# Patient Record
Sex: Male | Born: 2000 | Race: White | Hispanic: No | Marital: Single | Smoking: Never smoker
Health system: Southern US, Community
[De-identification: ages and names within clinical notes are randomized; demographics above are authoritative.]

## PROBLEM LIST (undated history)

## (undated) DIAGNOSIS — L709 Acne, unspecified: Secondary | ICD-10-CM

---
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
## 2019-04-21 ENCOUNTER — Emergency Department
Admission: EM | Admit: 2019-04-21 | Discharge: 2019-04-21 | Disposition: A | Discharge status: Home / Self Care | Payer: Worker's Compensation | Attending: Emergency Medicine | Admitting: Emergency Medicine

## 2019-04-21 ENCOUNTER — Emergency Department: Payer: Worker's Compensation

## 2019-04-21 ENCOUNTER — Other Ambulatory Visit: Payer: Self-pay

## 2019-04-21 DIAGNOSIS — S9781XA Crushing injury of right foot, initial encounter: Secondary | ICD-10-CM | POA: Diagnosis not present

## 2019-04-21 DIAGNOSIS — Y990 Civilian activity done for income or pay: Secondary | ICD-10-CM | POA: Diagnosis not present

## 2019-04-21 DIAGNOSIS — Y9389 Activity, other specified: Secondary | ICD-10-CM | POA: Insufficient documentation

## 2019-04-21 DIAGNOSIS — S99921A Unspecified injury of right foot, initial encounter: Secondary | ICD-10-CM | POA: Diagnosis present

## 2019-04-21 DIAGNOSIS — Y9289 Other specified places as the place of occurrence of the external cause: Secondary | ICD-10-CM | POA: Diagnosis not present

## 2019-04-21 DIAGNOSIS — W231XXA Caught, crushed, jammed, or pinched between stationary objects, initial encounter: Secondary | ICD-10-CM | POA: Diagnosis not present

## 2019-04-21 NOTE — ED Provider Notes (Signed)
[facility] Emergency Department Provider Note  ____________________________________________   First MD Initiated Contact with Patient 04/21/19 1036     (approximate)  I have reviewed the triage vital signs and the nursing notes.   HISTORY  Chief Complaint Foot Injury    HPI Stephen Shields is a 18 y.o. male presents emergency department complaint of right foot pain.  States he ran over his foot with a pallet jack at work at [employer].  Questionable whether is broken or not.  His Tdap is up-to-date.  Denies any other injuries at this time.  Denies any numbness or tingling.    Past Medical History:  Diagnosis Date  . Acne     There are no active problems to display for this patient.   History reviewed. No pertinent surgical history.  Prior to Admission medications   Not on File    Allergies Shrimp [shellfish allergy]  No family history on file.  Social History Social History   Tobacco Use  . Smoking status: Never Smoker  . Smokeless tobacco: Never Used  Substance Use Topics  . Alcohol use: Not Currently  . Drug use: No    Review of Systems  Constitutional: No fever/chills Eyes: No visual changes. ENT: No sore throat. Respiratory: Denies cough Genitourinary: Negative for dysuria. Musculoskeletal: Negative for back pain.  Positive for right foot pain Skin: Negative for rash.    ____________________________________________   PHYSICAL EXAM:  VITAL SIGNS: ED Triage Vitals  Enc Vitals Group     BP 04/21/19 1022 (!) 143/75     Pulse Rate 04/21/19 1022 76     Resp 04/21/19 1022 16     Temp 04/21/19 1022 98.3 F (36.8 C)     Temp Source 04/21/19 1022 Oral     SpO2 04/21/19 1022 100 %     Weight 04/21/19 1037 254 lb (115.2 kg)     Height 04/21/19 1037 5\' 10"  (1.778 m)     Head Circumference --      Peak Flow --      Pain Score 04/21/19 1035 4     Pain Loc --      Pain Edu? --      Excl. in GC? --     Constitutional:  Alert and oriented. Well appearing and in no acute distress. Eyes: Conjunctivae are normal.  Head: Atraumatic. Nose: No congestion/rhinnorhea. Mouth/Throat: Mucous membranes are moist.   Neck:  supple no lymphadenopathy noted Cardiovascular: Normal rate, regular rhythm. Respiratory: Normal respiratory effort.  No retractions GU: deferred Musculoskeletal: FROM all extremities, warm and well perfused, right foot is bruised distally along the lateral aspect, tender to palpation Neurologic:  Normal speech and language.  Skin:  Skin is warm, dry and intact. No rash noted. Psychiatric: Mood and affect are normal. Speech and behavior are normal.  ____________________________________________   LABS (all labs ordered are listed, but only abnormal results are displayed)  Labs Reviewed - No data to display ____________________________________________   ____________________________________________  RADIOLOGY  X-ray of the right foot is negative for fracture  ____________________________________________   PROCEDURES  Procedure(s) performed: Ace wrap and postop shoe applied by nursing staff  Procedures    ____________________________________________   INITIAL IMPRESSION / ASSESSMENT AND PLAN / ED COURSE  Pertinent labs & imaging results that were available during my care of the patient were reviewed by me and considered in my medical decision making (see chart for details).   Patient is 18 year old male presents emergency department with  complaints of a injury to the right foot while at work at [employer].  Patient states that he fully loaded pallet was being removed from a truck and rolled over his foot.  He denies any other injuries at this time.  Physical exam shows the right foot to be bruised tender and swollen at the lateral aspect along the fourth and fifth metatarsals.  Neurovascular is intact.  Remainder exam is unremarkable  X-ray of the right foot is negative for  fracture  Explained the results to the patient.  Explained the concerns of a crush injury of the foot.  He is to keep foot elevated and iced.  Take ibuprofen for pain as needed.  Return to the emergency department if any signs of compartment syndrome.  Follow-up with podiatry if not better by Thursday, April 25, 2019.  He states he understands will comply.  Worker's Comp. forms were obtained and filled out.  The patient is out of work until Wednesday by his choice as he has vacation days already scheduled.  He is to return Thursday if back to normal.  Otherwise he is to return Thursday on light duty and continue to wear the postop shoe.  Therefore he would need follow-up with podiatry if not better on Thursday, April 25, 2019.    Stephen Shields was evaluated in Emergency Department on 04/21/2019 for the symptoms described in the history of present illness. He was evaluated in the context of the global COVID-19 pandemic, which necessitated consideration that the patient might be at risk for infection with the SARS-CoV-2 virus that causes COVID-19. Institutional protocols and algorithms that pertain to the evaluation of patients at risk for COVID-19 are in a state of rapid change based on information released by regulatory bodies including the CDC and federal and state organizations. These policies and algorithms were followed during the patient's care in the ED.   As part of my medical decision making, I reviewed the following data within the [place] notes reviewed and incorporated, Old chart reviewed, Radiograph reviewed x-ray right foot is negative, Notes from prior ED visits and [place] Controlled Substance Database  ____________________________________________   FINAL CLINICAL IMPRESSION(S) / ED DIAGNOSES  Final diagnoses:  Crushing injury of right foot, initial encounter      NEW MEDICATIONS STARTED DURING THIS VISIT:  There are no discharge medications for this patient.     Note:  This document was prepared using Dragon voice recognition software and may include unintentional dictation errors.    Versie Starks, PA-C 04/21/19 1355    Nance Pear, MD 04/21/19 [phone]

## 2019-04-21 NOTE — Discharge Instructions (Signed)
Keep his foot elevated and iced.  Wear the postop shoe for the next several days.  If you begin to have a lot of numbness and tingling along with increased swelling of the foot you would need to be evaluated immediately at any emergency department.  Take ibuprofen for pain as needed.  Follow-up with podiatry if not better by Thursday, April 25, 2019.  Return to the emergency department as needed.

## 2019-04-21 NOTE — ED Triage Notes (Signed)
Pt states he accidentally ran over his right foot with a loaded pallet jack while at work at [employer] today. Some swelling and bruising to the area proximal to the 3-5th toe on the right foot.

## 2019-04-21 NOTE — ED Notes (Signed)
Waiting for pt mother to bring his ID before able to perform workers comp screening

## 2020-04-10 IMAGING — DX RIGHT FOOT COMPLETE - 3+ VIEW
3 series · 3 of 3 positions shown · non-contrast
Comparison: None.

CLINICAL DATA: Trauma to the right foot this morning with pain.

EXAM:
RIGHT FOOT COMPLETE - 3+ VIEW

[foot ap]
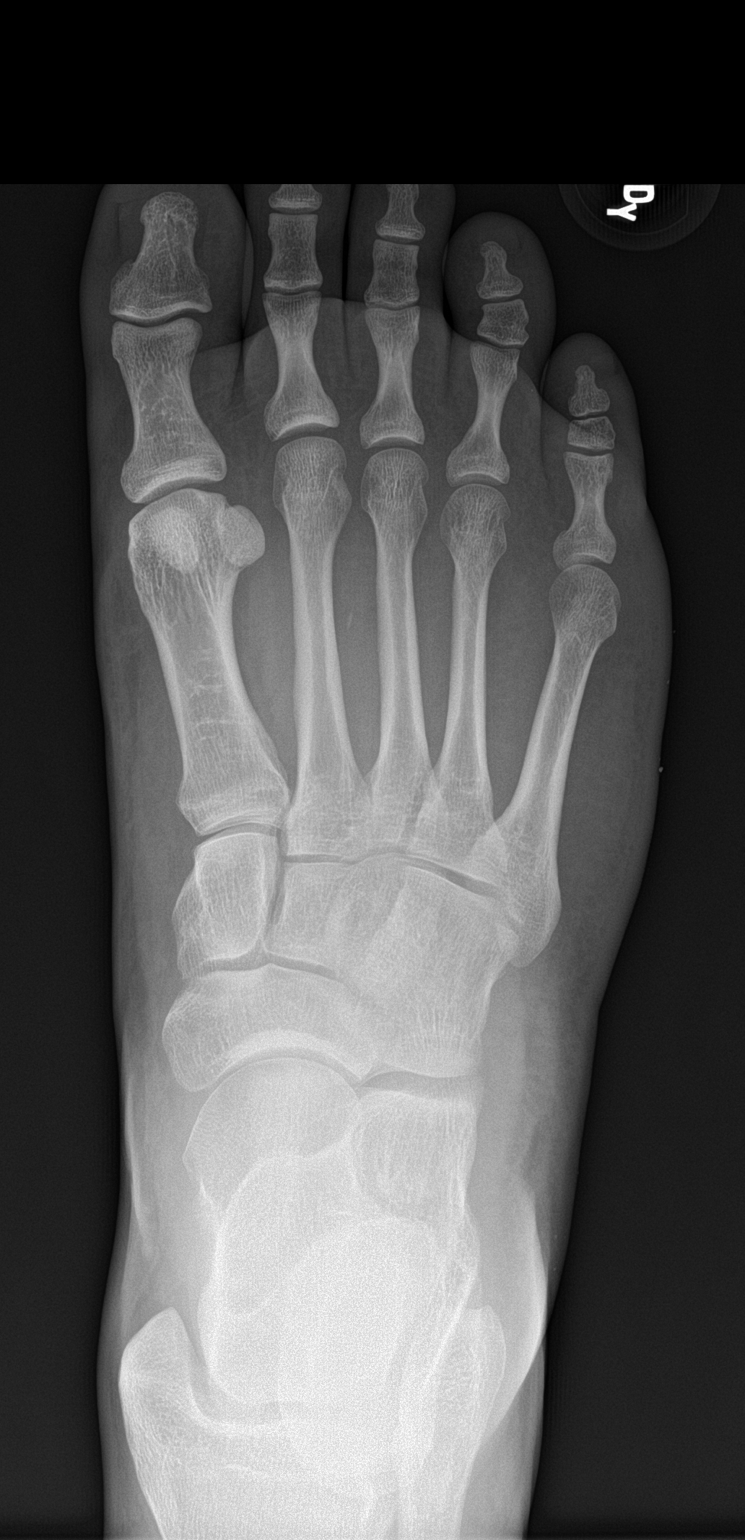

[foot obl]
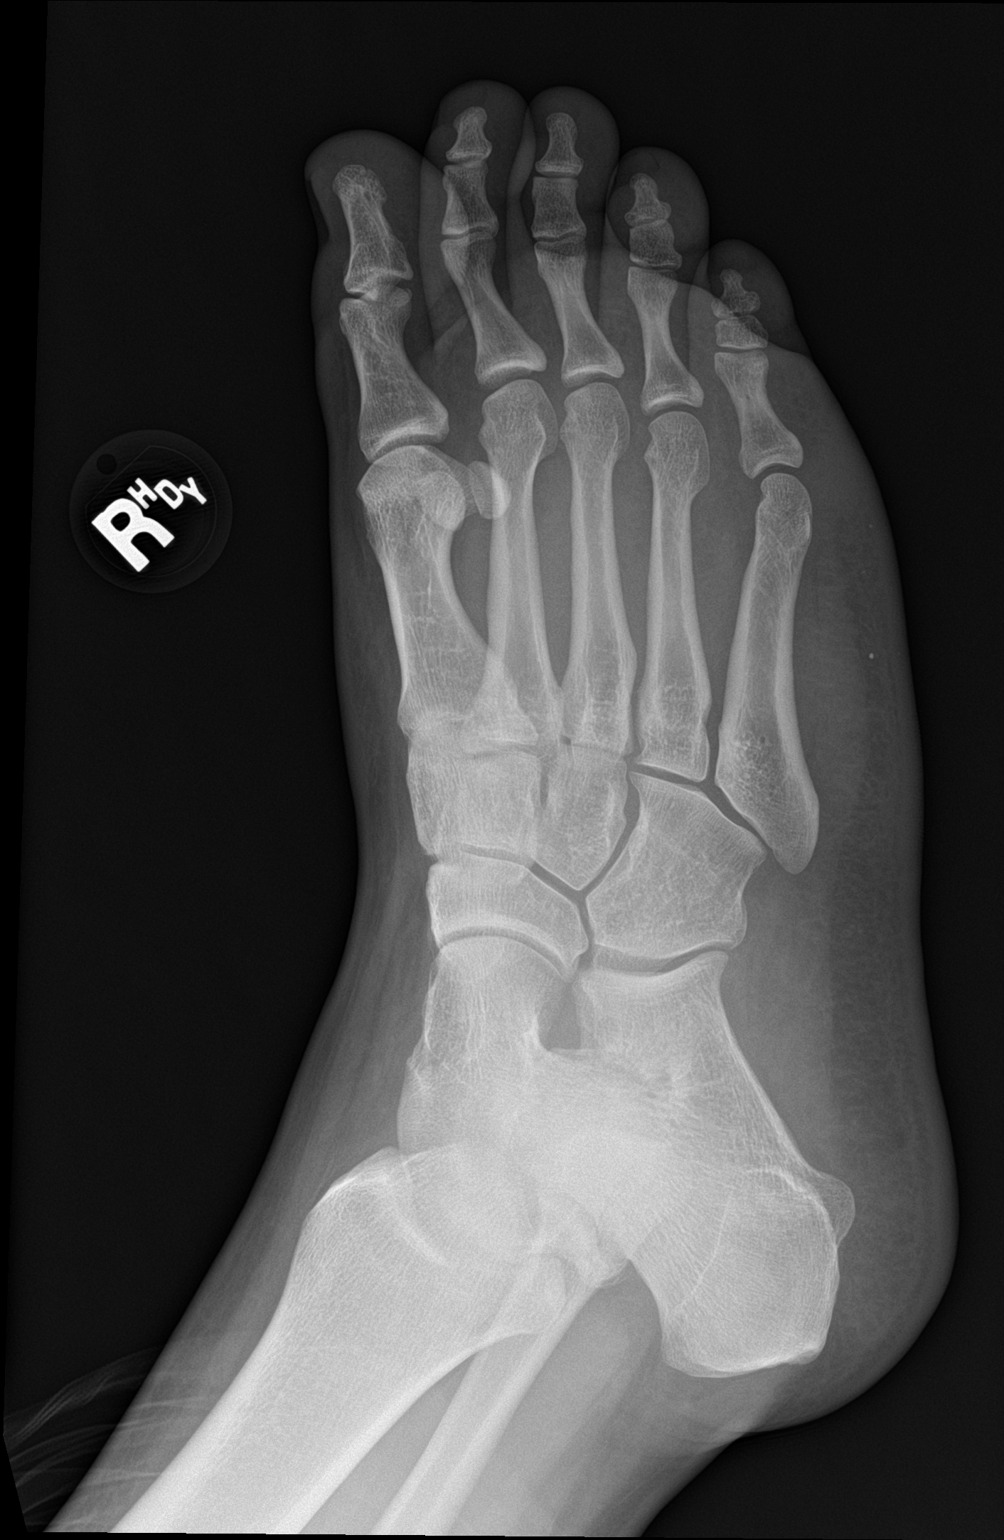

[foot lat]
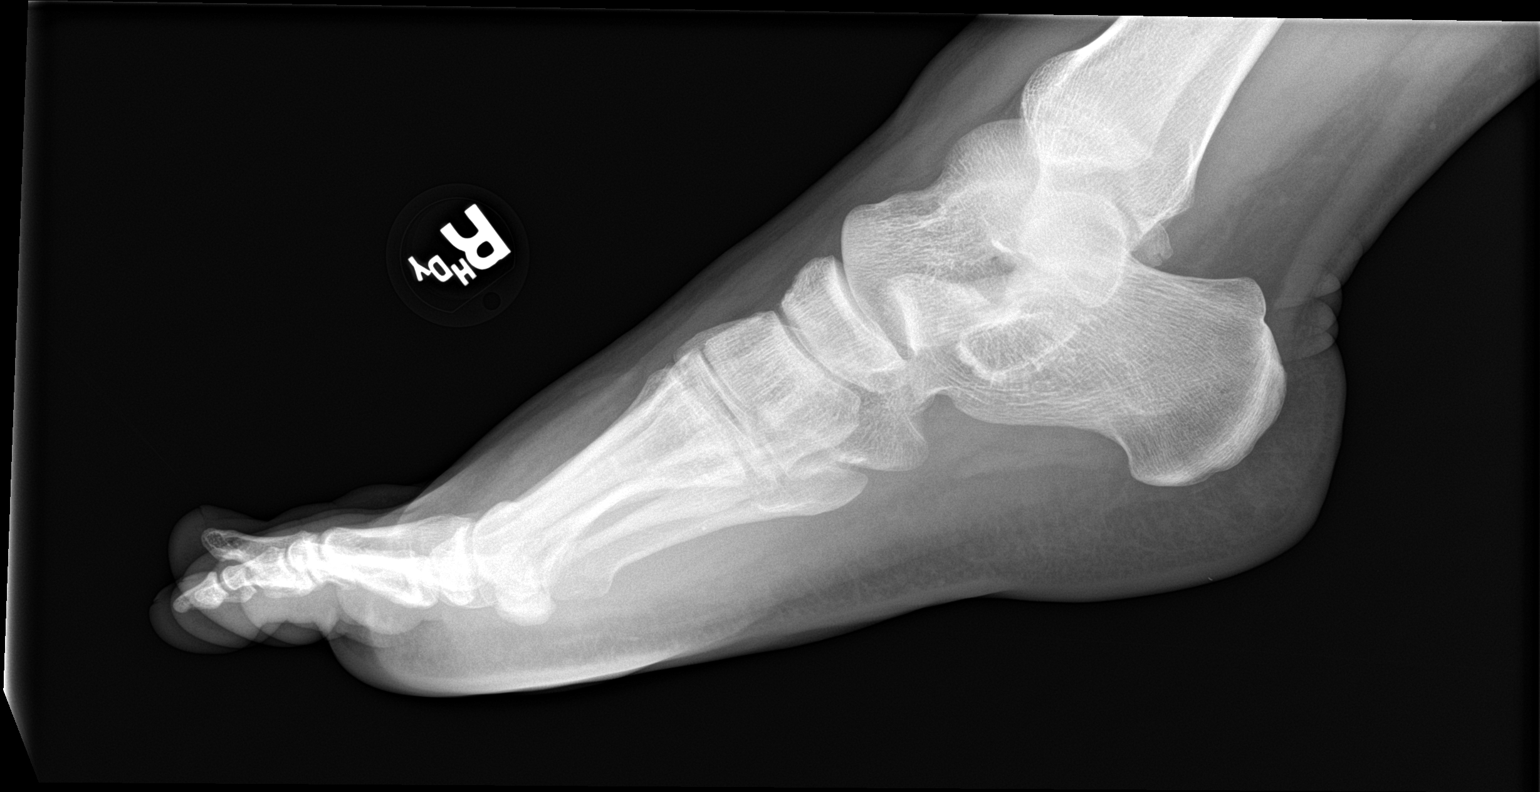

[3 of 3 positions shown; findings below may reference images not displayed]

FINDINGS: There is no evidence of fracture or dislocation. There is no
evidence of arthropathy or other focal bone abnormality. Soft
tissues are unremarkable.
IMPRESSION: Negative.

## 2020-05-11 DIAGNOSIS — Z20822 Contact with and (suspected) exposure to covid-19: Secondary | ICD-10-CM | POA: Diagnosis not present

## 2021-01-17 DEATH — deceased
